# Patient Record
Sex: Male | Born: 1975 | Race: White | Hispanic: No
Health system: Southern US, Community
[De-identification: ages and names within clinical notes are randomized; demographics above are authoritative.]

---

## 2005-01-26 ENCOUNTER — Encounter: Admission: RE | Admit: 2005-01-26 | Discharge: 2005-01-26 | Payer: Self-pay | Admitting: Family Medicine

## 2015-04-26 ENCOUNTER — Other Ambulatory Visit: Payer: Self-pay | Admitting: Family Medicine

## 2015-04-26 DIAGNOSIS — N509 Disorder of male genital organs, unspecified: Secondary | ICD-10-CM

## 2015-05-01 ENCOUNTER — Other Ambulatory Visit: Payer: Self-pay

## 2015-05-08 ENCOUNTER — Ambulatory Visit
Admission: RE | Admit: 2015-05-08 | Discharge: 2015-05-08 | Disposition: A | Discharge status: Home / Self Care | Payer: BLUE CROSS/BLUE SHIELD | Source: Ambulatory Visit | Attending: Family Medicine | Admitting: Family Medicine

## 2015-05-08 DIAGNOSIS — N509 Disorder of male genital organs, unspecified: Secondary | ICD-10-CM

## 2015-05-20 ENCOUNTER — Telehealth: Payer: Self-pay

## 2015-05-20 NOTE — Telephone Encounter (Signed)
Pt dropped off form to be filled out by Dr. Milus Glazier. Please call pt when completed @ [phone]. Form and chart were placed in Dr. Loma Boston box. Thank you

## 2015-05-21 NOTE — Telephone Encounter (Signed)
  Left message advising form ready to pick up.

## 2017-01-08 IMAGING — US US SCROTUM
1 series · 14 of 25 positions shown · non-contrast
Comparison: None.

CLINICAL DATA: Scrotal mass palpated on physical exam

EXAM:
ULTRASOUND OF SCROTUM
TECHNIQUE: Complete ultrasound examination of the testicles, epididymis, and
other scrotal structures was performed.

[Series 1: us scrotum · 0.08mm/px · 14 of 38 slices shown]
[im 1/38]
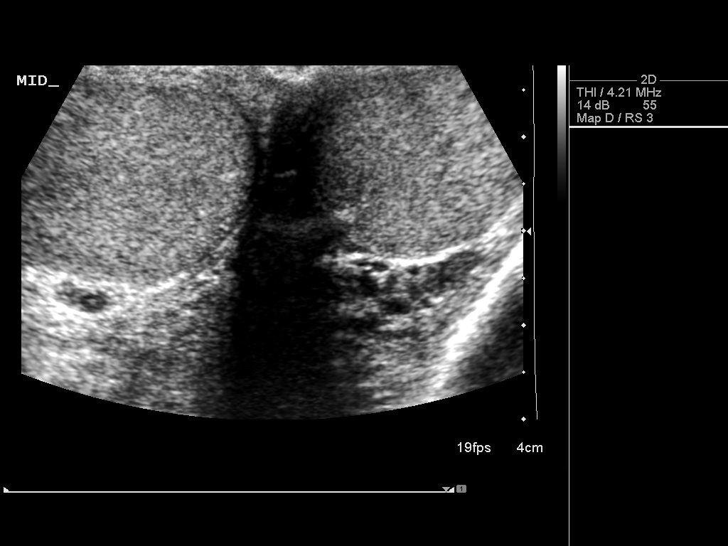
[im 4/38]
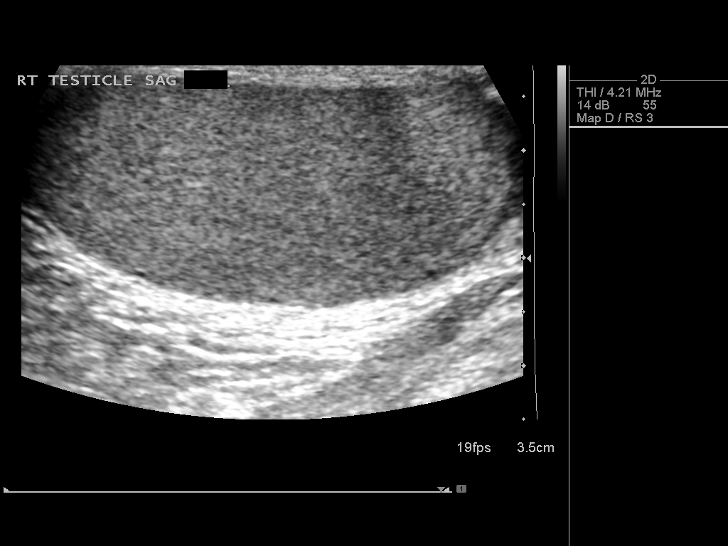
[im 7/38]
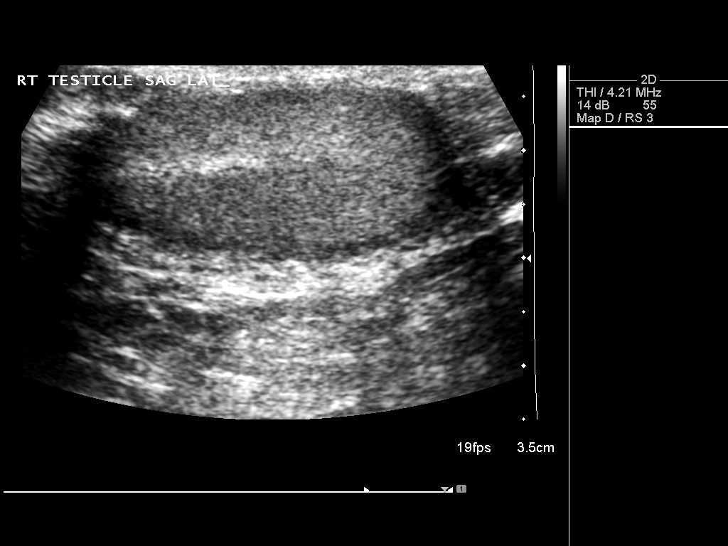
[im 10/38]
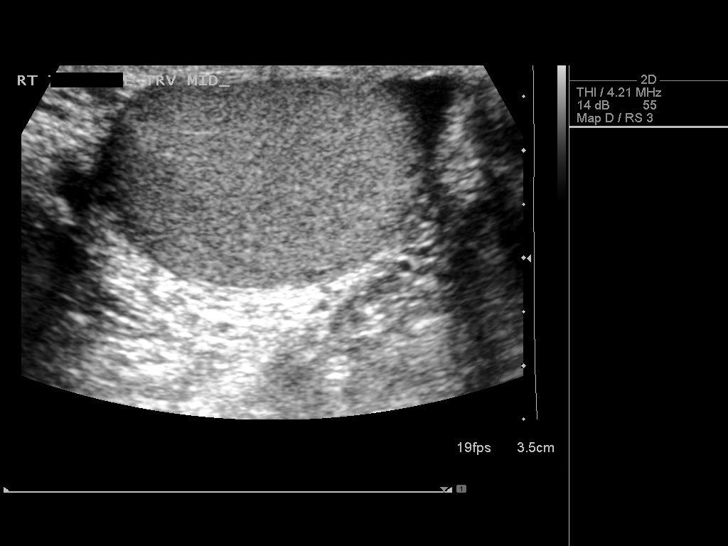
[im 13/38]
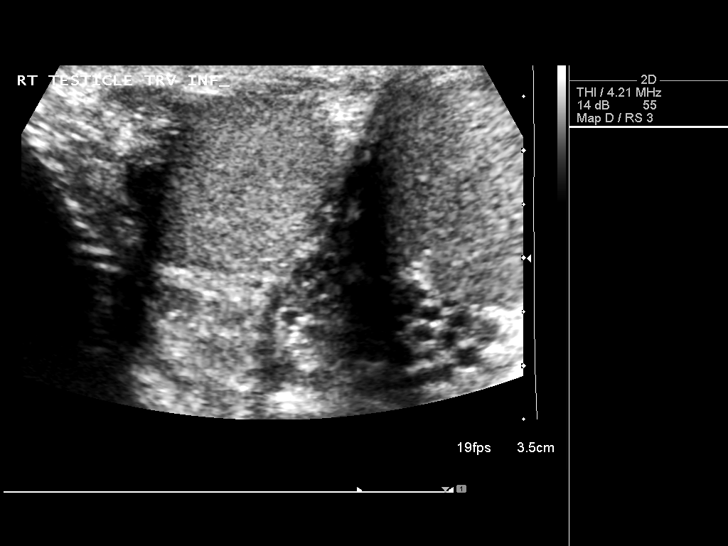
[im 14/38]
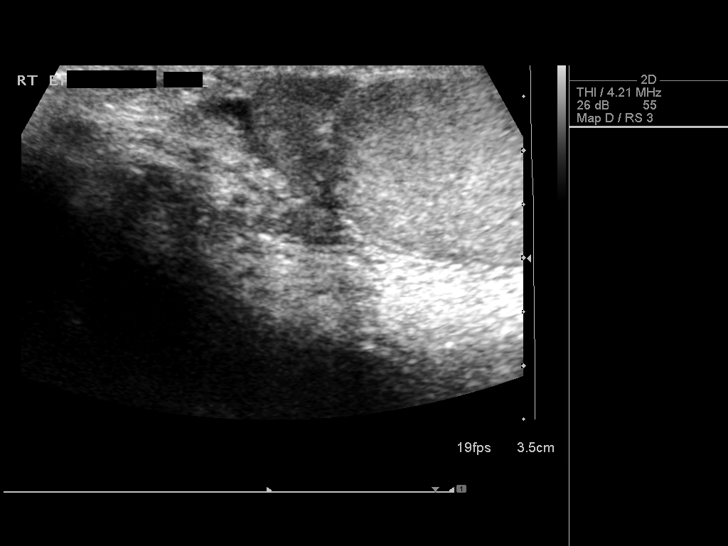
[im 17/38]
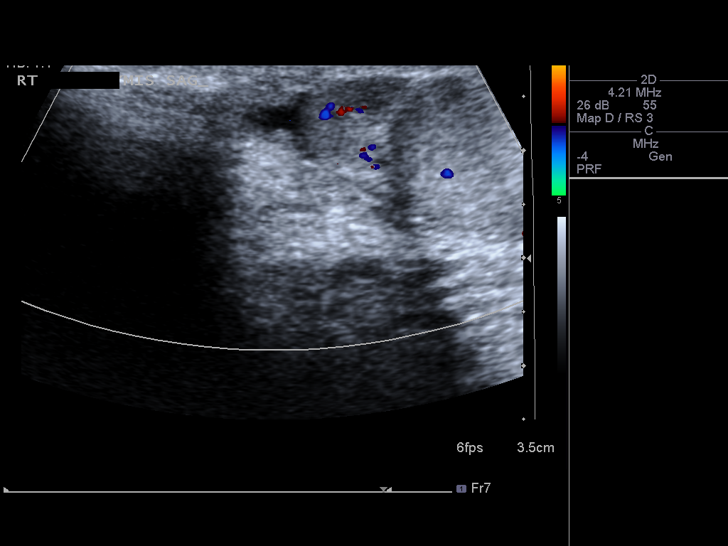
[im 21/38]
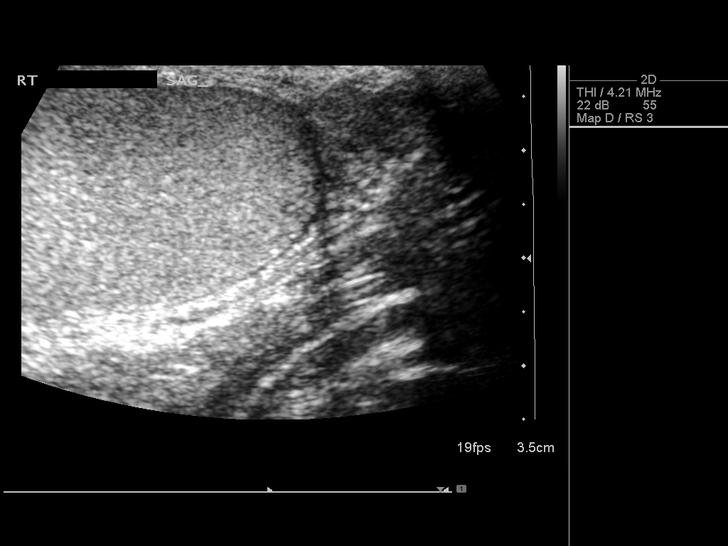
[im 24/38]
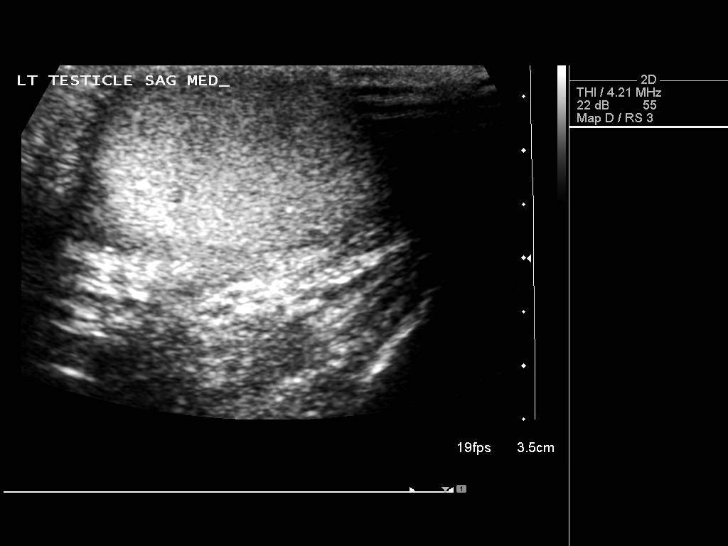
[im 25/38]
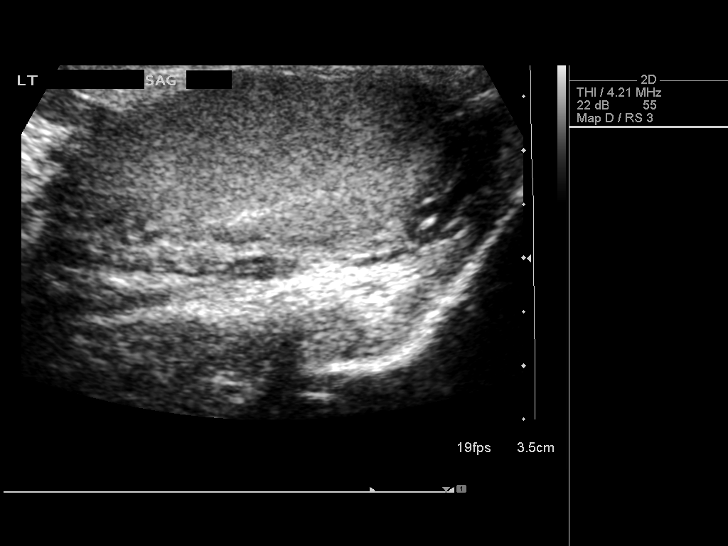
[im 28/38]
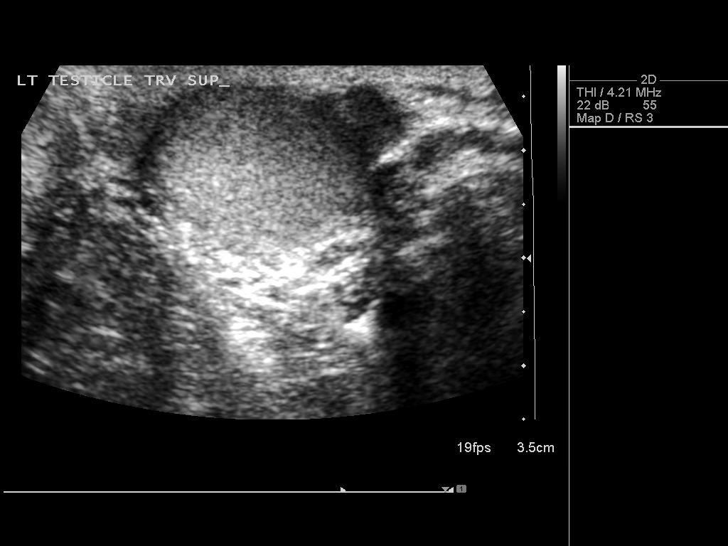
[im 31/38]
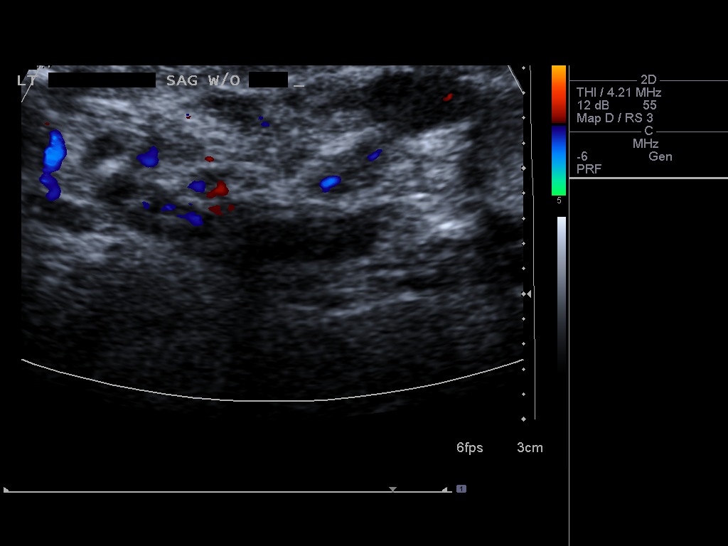
[im 34/38]
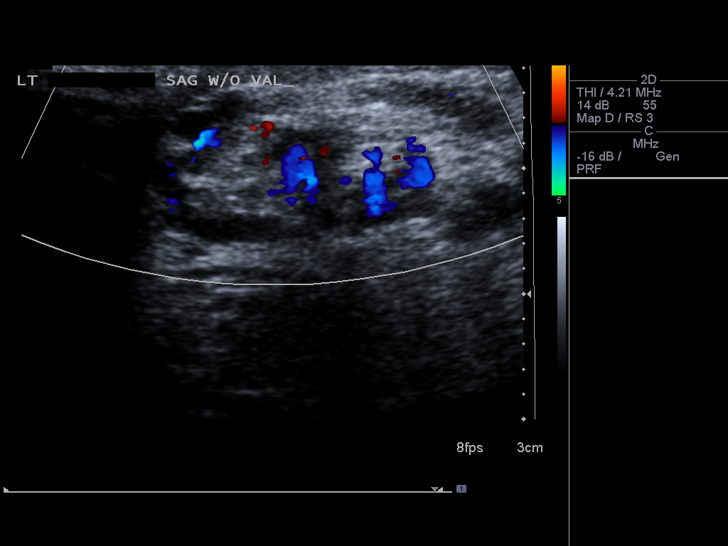
[im 38/38]
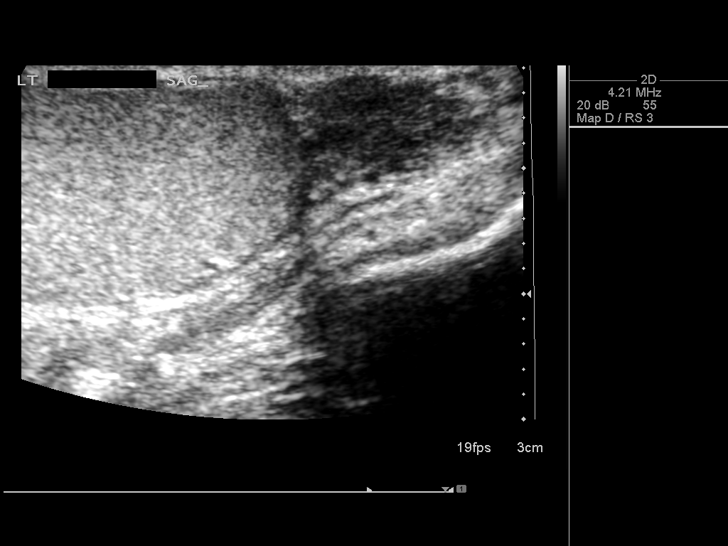

[14 of 25 positions shown; findings below may reference images not displayed]

FINDINGS: Right testicle

Measurements: The right testicle measures 4.5 x 2.0 x 3.0 cm.. No
intratesticular abnormality is seen. Blood flow is demonstrated to
the right testicle.

Left testicle

Measurements: The left testicle measures 4.4 x 2.3 x 2.9 cm.. No
intratesticular abnormality is seen, and blood flow is demonstrated
to the left testicle.

Right epididymis:  The right epididymis is unremarkable.

Left epididymis:  The left epididymis also is unremarkable.

Hydrocele:  No hydrocele is seen.

Varicocele: Mild left varicocele is present which augments with
Valsalva.
IMPRESSION: 1. No intratesticular abnormality is seen. Blood flow is
demonstrated to both testicles.
2. Mild left varicocele.

## 2017-05-10 IMAGING — CR DG CHEST 1V
1 series · 1 of 1 positions shown · non-contrast
Comparison: None.

CLINICAL DATA: Pre-employment exam

EXAM:
CHEST  1 VIEW

[PA]
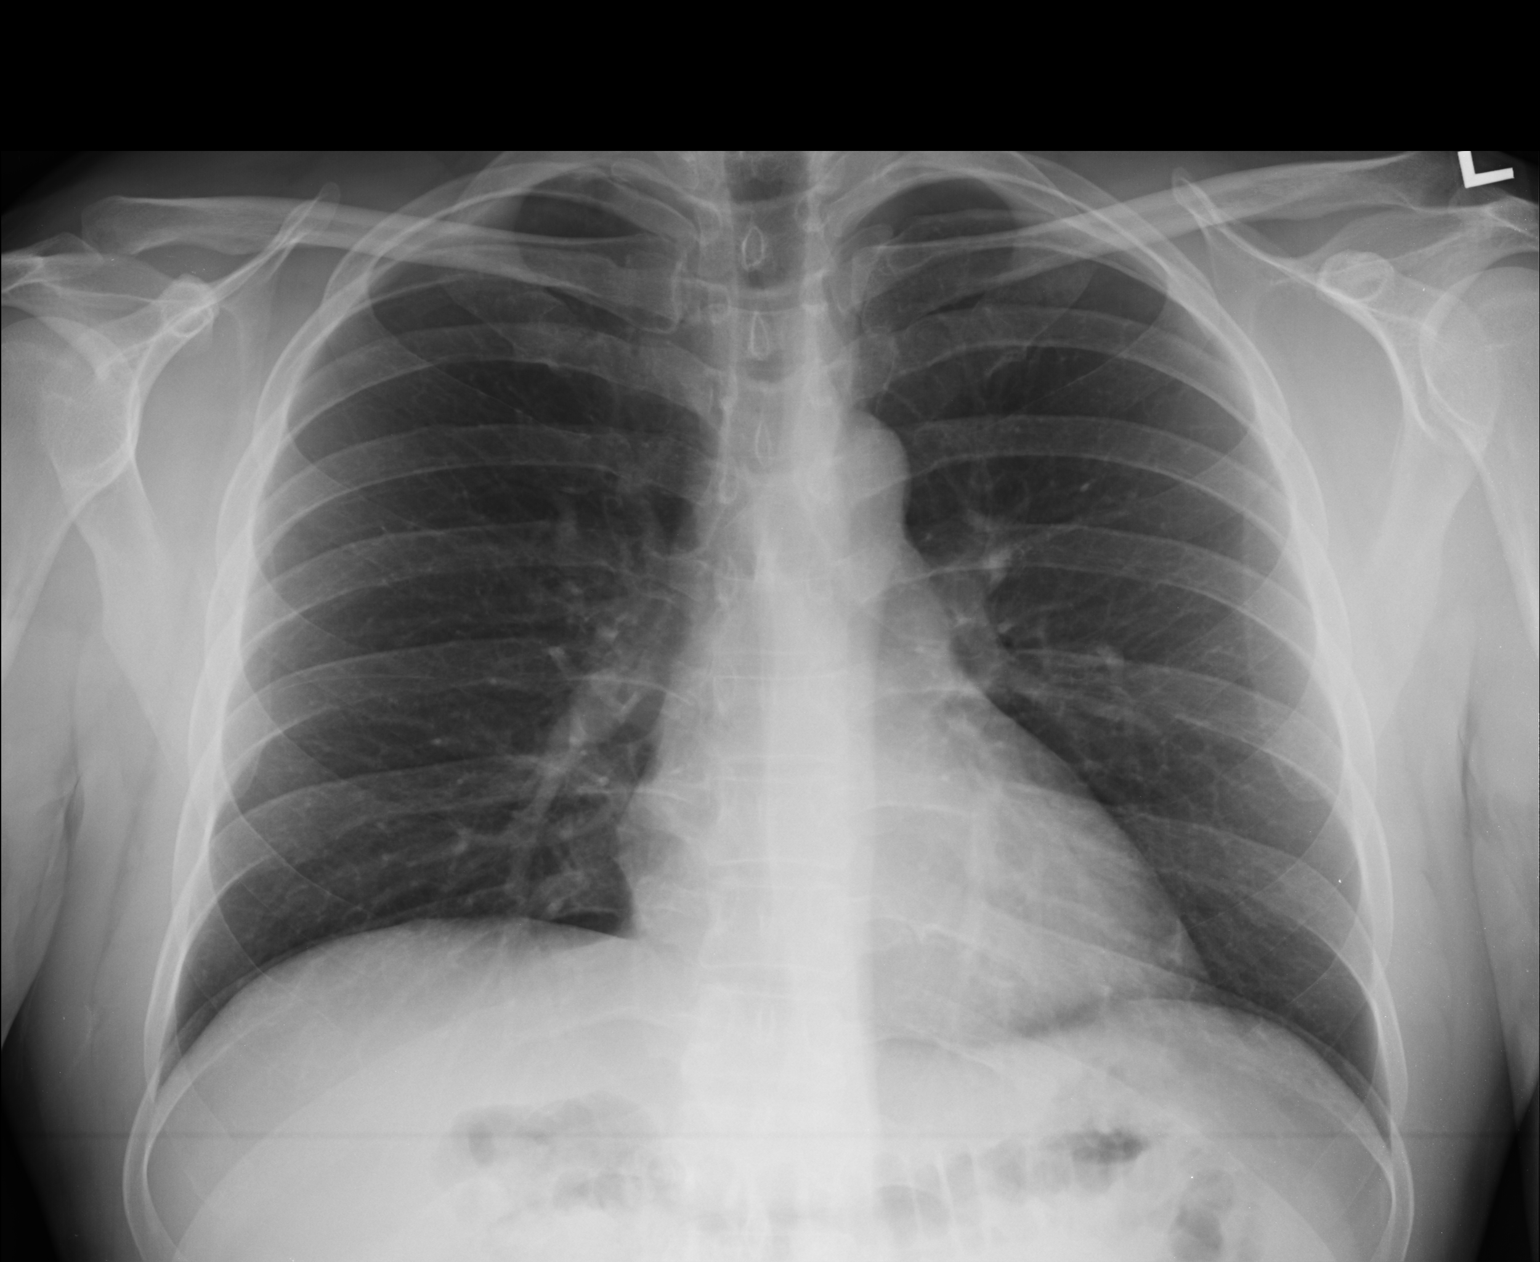

[1 of 1 positions shown; findings below may reference images not displayed]

FINDINGS: Cardiomediastinal silhouette is unremarkable. No acute infiltrate or
pleural effusion. No pulmonary edema. Bony thorax is unremarkable.
IMPRESSION: No active disease.

## 2022-12-24 ENCOUNTER — Other Ambulatory Visit (HOSPITAL_COMMUNITY): Payer: Self-pay
